# Patient Record
Sex: Male | Born: 1985 | Race: White | Hispanic: No | Marital: Single | Smoking: Current every day smoker
Health system: Southern US, Community
[De-identification: ages and names within clinical notes are randomized; demographics above are authoritative.]

---
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
## 2004-09-04 ENCOUNTER — Emergency Department (HOSPITAL_COMMUNITY): Admission: EM | Admit: 2004-09-04 | Discharge: 2004-09-04 | Payer: Self-pay | Admitting: Emergency Medicine

## 2005-03-06 ENCOUNTER — Emergency Department (HOSPITAL_COMMUNITY): Admission: EM | Admit: 2005-03-06 | Discharge: 2005-03-06 | Payer: Self-pay | Admitting: Emergency Medicine

## 2006-11-03 ENCOUNTER — Emergency Department (HOSPITAL_COMMUNITY): Admission: EM | Admit: 2006-11-03 | Discharge: 2006-11-03 | Payer: Self-pay | Admitting: Emergency Medicine

## 2009-03-21 ENCOUNTER — Emergency Department (HOSPITAL_COMMUNITY): Admission: EM | Admit: 2009-03-21 | Discharge: 2009-03-21 | Payer: Self-pay | Admitting: Emergency Medicine

## 2009-07-18 ENCOUNTER — Emergency Department (HOSPITAL_COMMUNITY): Admission: EM | Admit: 2009-07-18 | Discharge: 2009-07-18 | Payer: Self-pay | Admitting: Emergency Medicine

## 2016-02-17 ENCOUNTER — Encounter (HOSPITAL_BASED_OUTPATIENT_CLINIC_OR_DEPARTMENT_OTHER): Payer: Self-pay | Admitting: Emergency Medicine

## 2016-02-17 ENCOUNTER — Emergency Department (HOSPITAL_BASED_OUTPATIENT_CLINIC_OR_DEPARTMENT_OTHER)
Admission: EM | Admit: 2016-02-17 | Discharge: 2016-02-17 | Disposition: A | Discharge status: Home / Self Care | Payer: Self-pay | Attending: Emergency Medicine | Admitting: Emergency Medicine

## 2016-02-17 ENCOUNTER — Emergency Department (HOSPITAL_BASED_OUTPATIENT_CLINIC_OR_DEPARTMENT_OTHER): Payer: Self-pay

## 2016-02-17 DIAGNOSIS — Y939 Activity, unspecified: Secondary | ICD-10-CM | POA: Insufficient documentation

## 2016-02-17 DIAGNOSIS — S80811A Abrasion, right lower leg, initial encounter: Secondary | ICD-10-CM | POA: Insufficient documentation

## 2016-02-17 DIAGNOSIS — S80812A Abrasion, left lower leg, initial encounter: Secondary | ICD-10-CM | POA: Insufficient documentation

## 2016-02-17 DIAGNOSIS — W19XXXA Unspecified fall, initial encounter: Secondary | ICD-10-CM

## 2016-02-17 DIAGNOSIS — S99921A Unspecified injury of right foot, initial encounter: Secondary | ICD-10-CM

## 2016-02-17 DIAGNOSIS — W11XXXA Fall on and from ladder, initial encounter: Secondary | ICD-10-CM | POA: Insufficient documentation

## 2016-02-17 DIAGNOSIS — F17200 Nicotine dependence, unspecified, uncomplicated: Secondary | ICD-10-CM | POA: Insufficient documentation

## 2016-02-17 DIAGNOSIS — Y999 Unspecified external cause status: Secondary | ICD-10-CM | POA: Insufficient documentation

## 2016-02-17 DIAGNOSIS — Y92019 Unspecified place in single-family (private) house as the place of occurrence of the external cause: Secondary | ICD-10-CM | POA: Insufficient documentation

## 2016-02-17 MED ORDER — OXYCODONE-ACETAMINOPHEN 5-325 MG PO TABS
1.0000 | ORAL_TABLET | Freq: Once | ORAL | Status: AC
  Administered 2016-02-17: 1 via ORAL
  Filled 2016-02-17: qty 1

## 2016-02-17 MED ORDER — NAPROXEN 250 MG PO TABS
500.0000 mg | ORAL_TABLET | Freq: Once | ORAL | Status: AC
Start: 2016-02-17 — End: 2016-02-17
  Administered 2016-02-17: 500 mg via ORAL
  Filled 2016-02-17: qty 2

## 2016-02-17 MED ORDER — NAPROXEN 500 MG PO TABS: 500.0000 mg | ORAL_TABLET | Freq: Two times a day (BID) | ORAL | 0 refills | Status: AC

## 2016-02-17 NOTE — ED Notes (Signed)
Pt given d/c instructions as per chart. Rx x 1. Verbalizes understanding. No questions. 

## 2016-02-17 NOTE — Discharge Instructions (Signed)
Expect your soreness to increase over the next 2-3 days. Take it easy, but do not lay around too much as this may make the stiffness worse. Take 500 mg of naproxen every 12 hours or 800 mg of ibuprofen every 8 hours for the next 3 days. Take these medications with food to avoid upset stomach.  Follow-up with orthopedics should symptoms fail to resolve. Weightbearing as tolerated. Elevate the extremity whenever possible. Apply ice to reduce inflammation. Naproxen or ibuprofen as needed to reduce pain and inflammation.

## 2016-02-17 NOTE — ED Triage Notes (Signed)
Pt in c/o R ankle and foot pain following fall from ladder today. Pt alert, interactive, in NAD. To triage in wheelchair.

## 2016-02-17 NOTE — ED Notes (Signed)
Fell from ladder about 1530 this afternoon. Rode ladder down and landed on concrete. Abrasions to bilat LE. C/O right foot and ankle pain. No LOC.

## 2016-02-17 NOTE — ED Provider Notes (Signed)
[facility]-EMERGENCY DEPT Provider Note   CSN: [id number] Arrival date & time: 02/17/16  [id number]  By signing my name below, I, Alex Freeman, attest that this documentation has been prepared under the direction and in the presence of Alex Mandell, PA-C Electronically Signed: Soijett Freeman, ED Scribe. 02/17/16. 8:18 PM.    History   Chief Complaint Chief Complaint  Patient presents with  . Fall  . Foot Pain    HPI Alex Freeman is a 30 y.o. male who presents to the Emergency Department complaining of fall onset PTA. He notes that he was installing a window onto his house while on a 12 foot extension ladder when he fell due to the ladder sliding out from underneath him. Patient states that he was about halfway up the ladder and "rode the ladder to the ground." Pt is having associated symptoms of abrasions to bilateral shins, right ankle pain, and right foot pain. He notes that he has tried ice without medications for the relief of his symptoms. He denies hitting his head, LOC, neuro deficits, neck/back pain, or any other symptoms or complaints.     The history is provided by the patient. No language interpreter was used.    History reviewed. No pertinent past medical history.  There are no active problems to display for this patient.   History reviewed. No pertinent surgical history.     Home Medications    Prior to Admission medications   Medication Sig Start Date End Date Taking? Authorizing Provider  naproxen (NAPROSYN) 500 MG tablet Take 1 tablet (500 mg total) by mouth 2 (two) times daily. 02/17/16   Anselm PancoastShawn C Gloristine Turrubiates, PA-C    Family History History reviewed. No pertinent family history.  Social History Social History  Substance Use Topics  . Smoking status: Current Every Day Smoker  . Smokeless tobacco: Not on file  . Alcohol use Yes     Allergies   Review of patient's allergies indicates no known allergies.   Review of Systems Review of Systems  Respiratory: Negative for  shortness of breath.   Cardiovascular: Negative for chest pain.  Gastrointestinal: Negative for abdominal pain, nausea and vomiting.  Musculoskeletal: Positive for arthralgias (right ankle and right foot). Negative for back pain, joint swelling, neck pain and neck stiffness.  Skin: Positive for wound (multiple abrasions to BLE). Negative for color change.  Neurological: Negative for dizziness, syncope, weakness, light-headedness, numbness and headaches.  All other systems reviewed and are negative.    Physical Exam Updated Vital Signs BP 145/90   Pulse 78   Temp 98.1 F (36.7 C)   Resp 18   Ht 5\' 10"  (1.778 m)   Wt 205 lb (93 kg)   SpO2 95%   BMI 29.41 kg/m   Physical Exam  Constitutional: He is oriented to person, place, and time. He appears well-developed and well-nourished. No distress.  HENT:  Head: Normocephalic and atraumatic.  Eyes: Conjunctivae and EOM are normal. Pupils are equal, round, and reactive to light.  Neck: Neck supple.  Cardiovascular: Normal rate, regular rhythm, normal heart sounds and intact distal pulses.   Pulmonary/Chest: Effort normal and breath sounds normal. No respiratory distress.  Abdominal: Soft. He exhibits no distension. There is no tenderness. There is no guarding.  Musculoskeletal: Normal range of motion. He exhibits no edema.       Right ankle: He exhibits swelling. He exhibits normal range of motion. Tenderness. Lateral malleolus and medial malleolus tenderness found. Achilles tendon normal.  Tenderness and  mild swelling over right lateral malleolus and to the area inferior to the right medial malleolus. Right achilles tendon intact. Circulation, motor, and sensory intact. Multiple abrasions across both anterior lower legs. Full ROM in all extremities and spine. No midline spinal tenderness.   Lymphadenopathy:    He has no cervical adenopathy.  Neurological: He is alert and oriented to person, place, and time. He has normal strength. No  sensory deficit. Coordination normal.  No sensory deficits. Strength 5/5 in all extremities. No gait disturbance. Coordination intact. Cranial nerves III-XII grossly intact.   Skin: Skin is warm and dry. He is not diaphoretic.  Psychiatric: He has a normal mood and affect. His behavior is normal.  Nursing note and vitals reviewed.    ED Treatments / Results  DIAGNOSTIC STUDIES: Oxygen Saturation is 95% on RA, adequate by my interpretation.    COORDINATION OF CARE: 8:13 PM Discussed treatment plan with pt at bedside which includes right ankle xray, right foot xray, naprosyn Rx, ice, ace wrap, and pt agreed to plan.   Radiology Dg Ankle Complete Right  Result Date: 02/17/2016 CLINICAL DATA:  Fall.  Right ankle pain. EXAM: RIGHT ANKLE - COMPLETE 3+ VIEW COMPARISON:  None. FINDINGS: There is no evidence of fracture, dislocation, or joint effusion. There is no evidence of arthropathy or other focal bone abnormality. Soft tissues are unremarkable. IMPRESSION: Negative. Electronically Signed   By: Delbert Phenix M.D.   On: 02/17/2016 19:37   Dg Foot Complete Right  Result Date: 02/17/2016 CLINICAL DATA:  Fall.  Right foot pain. EXAM: RIGHT FOOT COMPLETE - 3+ VIEW COMPARISON:  None. FINDINGS: There is no evidence of fracture or dislocation. There is no evidence of arthropathy or other focal bone abnormality. Soft tissues are unremarkable. IMPRESSION: Negative. Electronically Signed   By: Delbert Phenix M.D.   On: 02/17/2016 19:37    Procedures Procedures (including critical care time)  Medications Ordered in ED Medications  naproxen (NAPROSYN) tablet 500 mg (500 mg Oral Given 02/17/16 2044)  oxyCODONE-acetaminophen (PERCOCET/ROXICET) 5-325 MG per tablet 1 tablet (1 tablet Oral Given 02/17/16 2044)     Initial Impression / Assessment and Plan / ED Course  I have reviewed the triage vital signs and the nursing notes.  Pertinent imaging results that were available during my care of the patient  were reviewed by me and considered in my medical decision making (see chart for details).  Clinical Course    Patient X-Ray negative for obvious fracture or dislocation.  Pt advised to follow up with orthopedics. Patient given ace wrap while in ED. Will be discharged home with naprosyn Rx. Symptomatic therapy recommended and discussed. Patient will be discharged home & is agreeable with above plan. Return precautions discussed.   Findings and plan of care discussed with Drema Pry, MD.   Vitals:   02/17/16 1906 02/17/16 1907  BP: 145/90   Pulse: 78   Resp: 18   Temp: 98.1 F (36.7 C)   SpO2: 95%   Weight:  93 kg  Height:  5\' 10"  (1.778 m)     Final Clinical Impressions(s) / ED Diagnoses   Final diagnoses:  Fall, initial encounter  Foot injury, right, initial encounter    New Prescriptions Discharge Medication List as of 02/17/2016  8:18 PM    START taking these medications   Details  naproxen (NAPROSYN) 500 MG tablet Take 1 tablet (500 mg total) by mouth 2 (two) times daily., Starting Sat 02/17/2016, Print  I personally performed the services described in this documentation, which was scribed in my presence. The recorded information has been reviewed and is accurate.     Anselm Pancoast, PA-C 02/19/16 1749    Nira Conn, MD 02/21/16 [phone]

## 2016-09-05 ENCOUNTER — Emergency Department (HOSPITAL_COMMUNITY)
Admission: EM | Admit: 2016-09-05 | Discharge: 2016-09-05 | Disposition: A | Discharge status: Home / Self Care | Payer: Self-pay | Attending: Emergency Medicine | Admitting: Emergency Medicine

## 2016-09-05 ENCOUNTER — Encounter (HOSPITAL_COMMUNITY): Payer: Self-pay | Admitting: Emergency Medicine

## 2016-09-05 ENCOUNTER — Emergency Department (HOSPITAL_COMMUNITY): Payer: Self-pay

## 2016-09-05 DIAGNOSIS — S80812A Abrasion, left lower leg, initial encounter: Secondary | ICD-10-CM | POA: Insufficient documentation

## 2016-09-05 DIAGNOSIS — R51 Headache: Secondary | ICD-10-CM | POA: Insufficient documentation

## 2016-09-05 DIAGNOSIS — F17200 Nicotine dependence, unspecified, uncomplicated: Secondary | ICD-10-CM | POA: Insufficient documentation

## 2016-09-05 DIAGNOSIS — Y939 Activity, unspecified: Secondary | ICD-10-CM | POA: Insufficient documentation

## 2016-09-05 DIAGNOSIS — R109 Unspecified abdominal pain: Secondary | ICD-10-CM | POA: Insufficient documentation

## 2016-09-05 DIAGNOSIS — S39012A Strain of muscle, fascia and tendon of lower back, initial encounter: Secondary | ICD-10-CM | POA: Insufficient documentation

## 2016-09-05 DIAGNOSIS — Y999 Unspecified external cause status: Secondary | ICD-10-CM | POA: Insufficient documentation

## 2016-09-05 DIAGNOSIS — R918 Other nonspecific abnormal finding of lung field: Secondary | ICD-10-CM | POA: Insufficient documentation

## 2016-09-05 DIAGNOSIS — Y92410 Unspecified street and highway as the place of occurrence of the external cause: Secondary | ICD-10-CM | POA: Insufficient documentation

## 2016-09-05 DIAGNOSIS — S80811A Abrasion, right lower leg, initial encounter: Secondary | ICD-10-CM | POA: Insufficient documentation

## 2016-09-05 LAB — I-STAT CHEM 8, ED
BUN: 8 mg/dL (ref 6–20)
CHLORIDE: 106 mmol/L (ref 101–111)
Calcium, Ion: 1.16 mmol/L (ref 1.15–1.40)
Creatinine, Ser: 1 mg/dL (ref 0.61–1.24)
Glucose, Bld: 93 mg/dL (ref 65–99)
HEMATOCRIT: 41 % (ref 39.0–52.0)
Hemoglobin: 13.9 g/dL (ref 13.0–17.0)
POTASSIUM: 3.7 mmol/L (ref 3.5–5.1)
SODIUM: 139 mmol/L (ref 135–145)
TCO2: 24 mmol/L (ref 0–100)

## 2016-09-05 LAB — URINALYSIS, ROUTINE W REFLEX MICROSCOPIC
Bilirubin Urine: NEGATIVE
Glucose, UA: NEGATIVE mg/dL
Hgb urine dipstick: NEGATIVE
Ketones, ur: NEGATIVE mg/dL
LEUKOCYTES UA: NEGATIVE
Nitrite: NEGATIVE
Protein, ur: NEGATIVE mg/dL
SPECIFIC GRAVITY, URINE: 1.002 — AB (ref 1.005–1.030)
pH: 6 (ref 5.0–8.0)

## 2016-09-05 MED ORDER — FENTANYL CITRATE (PF) 100 MCG/2ML IJ SOLN
50.0000 ug | Freq: Once | INTRAMUSCULAR | Status: AC
  Administered 2016-09-05: 50 ug via INTRAVENOUS
  Filled 2016-09-05: qty 2

## 2016-09-05 MED ORDER — IOPAMIDOL (ISOVUE-300) INJECTION 61%
100.0000 mL | Freq: Once | INTRAVENOUS | Status: AC | PRN
  Administered 2016-09-05: 100 mL via INTRAVENOUS

## 2016-09-05 MED ORDER — HYDROCODONE-ACETAMINOPHEN 5-325 MG PO TABS: 1.0000 | ORAL_TABLET | Freq: Four times a day (QID) | ORAL | 0 refills | Status: AC | PRN

## 2016-09-05 MED ORDER — SODIUM CHLORIDE 0.9 % IV SOLN
INTRAVENOUS | Status: DC
  Administered 2016-09-05: 20:00:00 via INTRAVENOUS

## 2016-09-05 MED ORDER — SODIUM CHLORIDE 0.9 % IV SOLN: INTRAVENOUS | Status: DC

## 2016-09-05 MED ORDER — ONDANSETRON HCL 4 MG/2ML IJ SOLN
4.0000 mg | Freq: Once | INTRAMUSCULAR | Status: AC
  Administered 2016-09-05: 4 mg via INTRAVENOUS
  Filled 2016-09-05: qty 2

## 2016-09-05 MED ORDER — NAPROXEN 500 MG PO TABS: 500.0000 mg | ORAL_TABLET | Freq: Two times a day (BID) | ORAL | 0 refills | Status: AC
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
## 2016-09-05 NOTE — ED Notes (Signed)
Patient was given 2 mg of morphine IV by [facility].
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
## 2016-09-05 NOTE — ED Notes (Signed)
Pt alert & oriented x4, stable gait. Patient given discharge instructions, paperwork & prescription(s). Patient informed not to drive, operate any equipment & handel any important documents 4 hours after taking pain medication. Patient  instructed to stop at the registration desk to finish any additional paperwork. Patient  verbalized understanding. Pt left department w/ no further questions. 

## 2017-02-02 IMAGING — CR DG ANKLE COMPLETE 3+V*R*
3 series · 3 of 3 positions shown · non-contrast
Comparison: None.

CLINICAL DATA: Fall.  Right ankle pain.

EXAM:
RIGHT ANKLE - COMPLETE 3+ VIEW

[t ankle joint ap right]
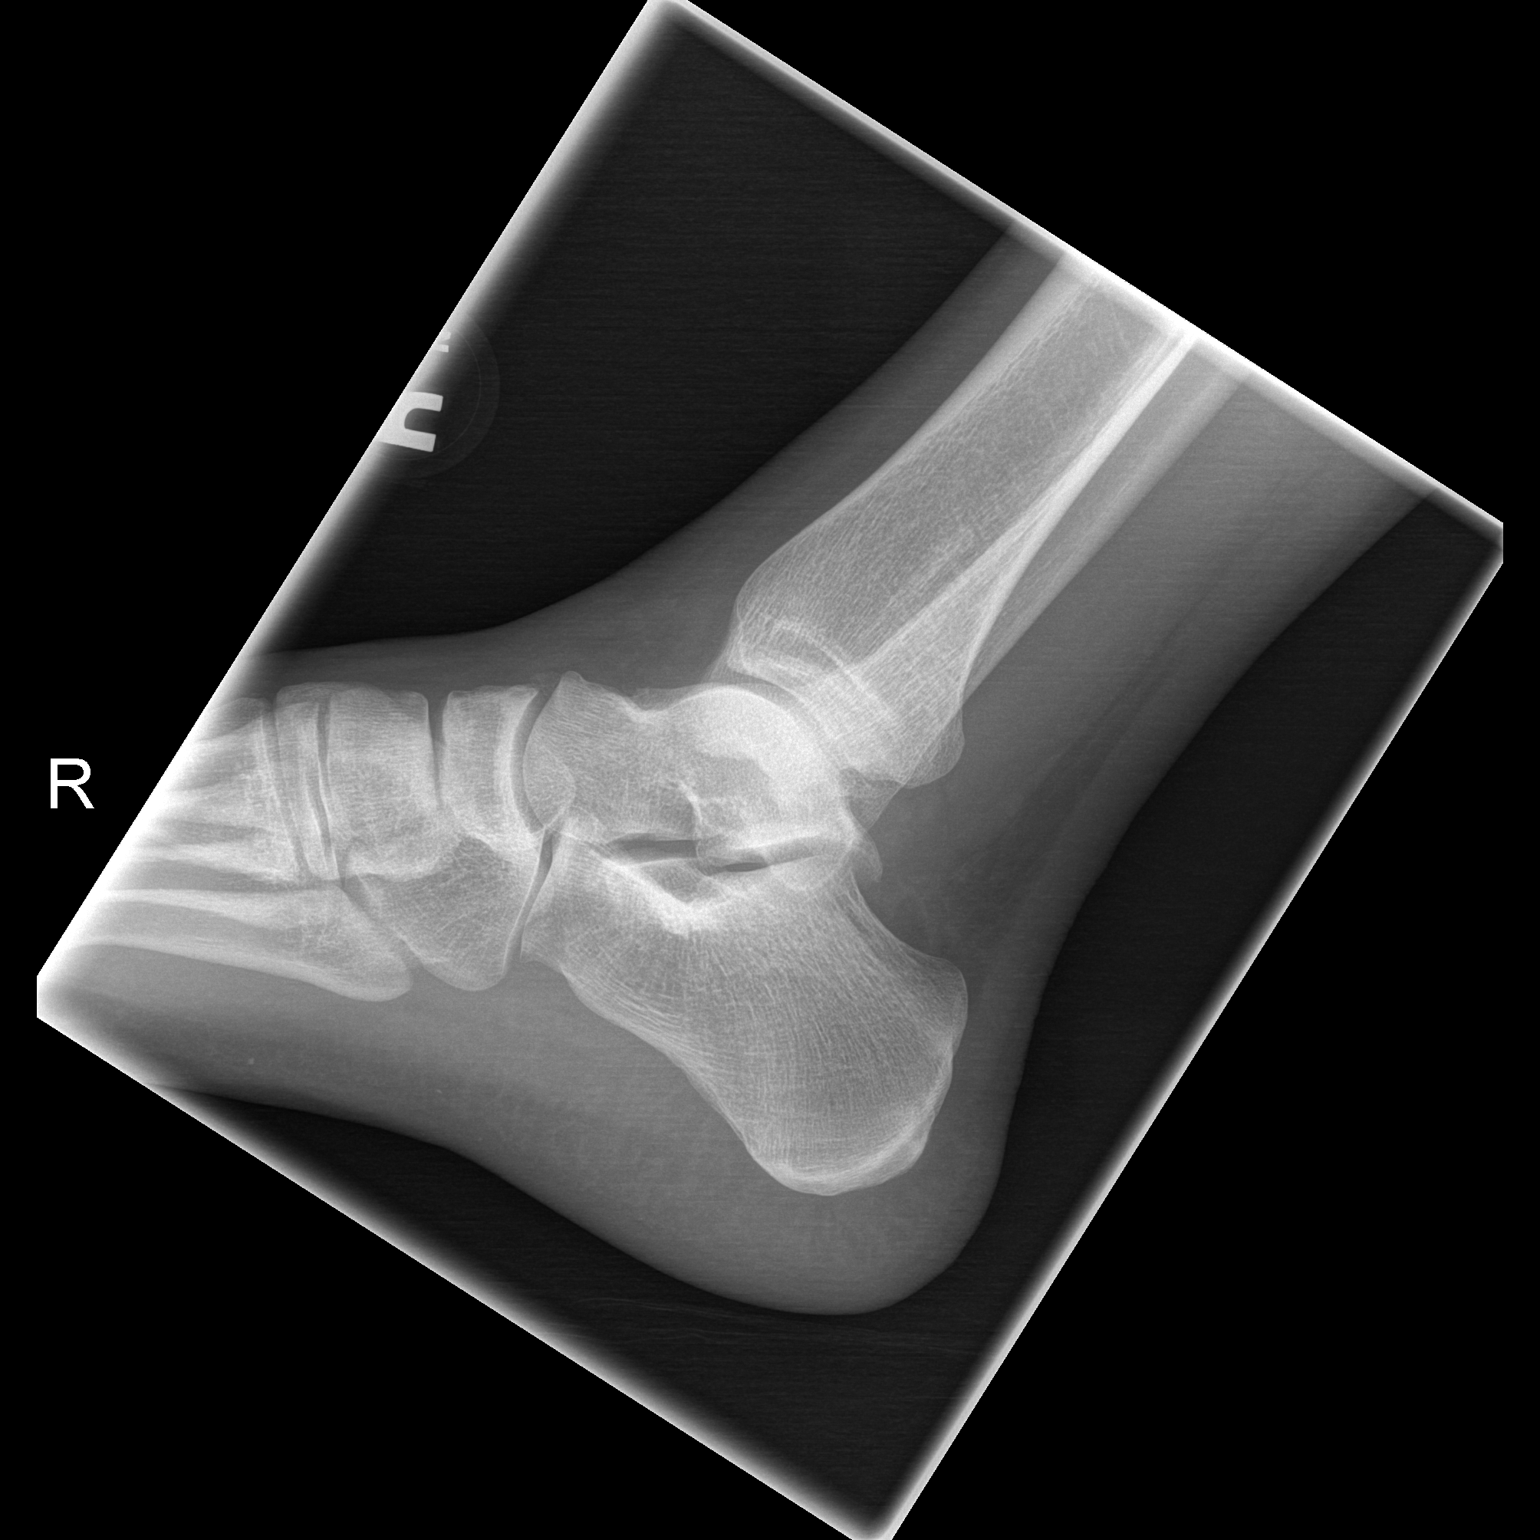

[t ankle joint oblique right]
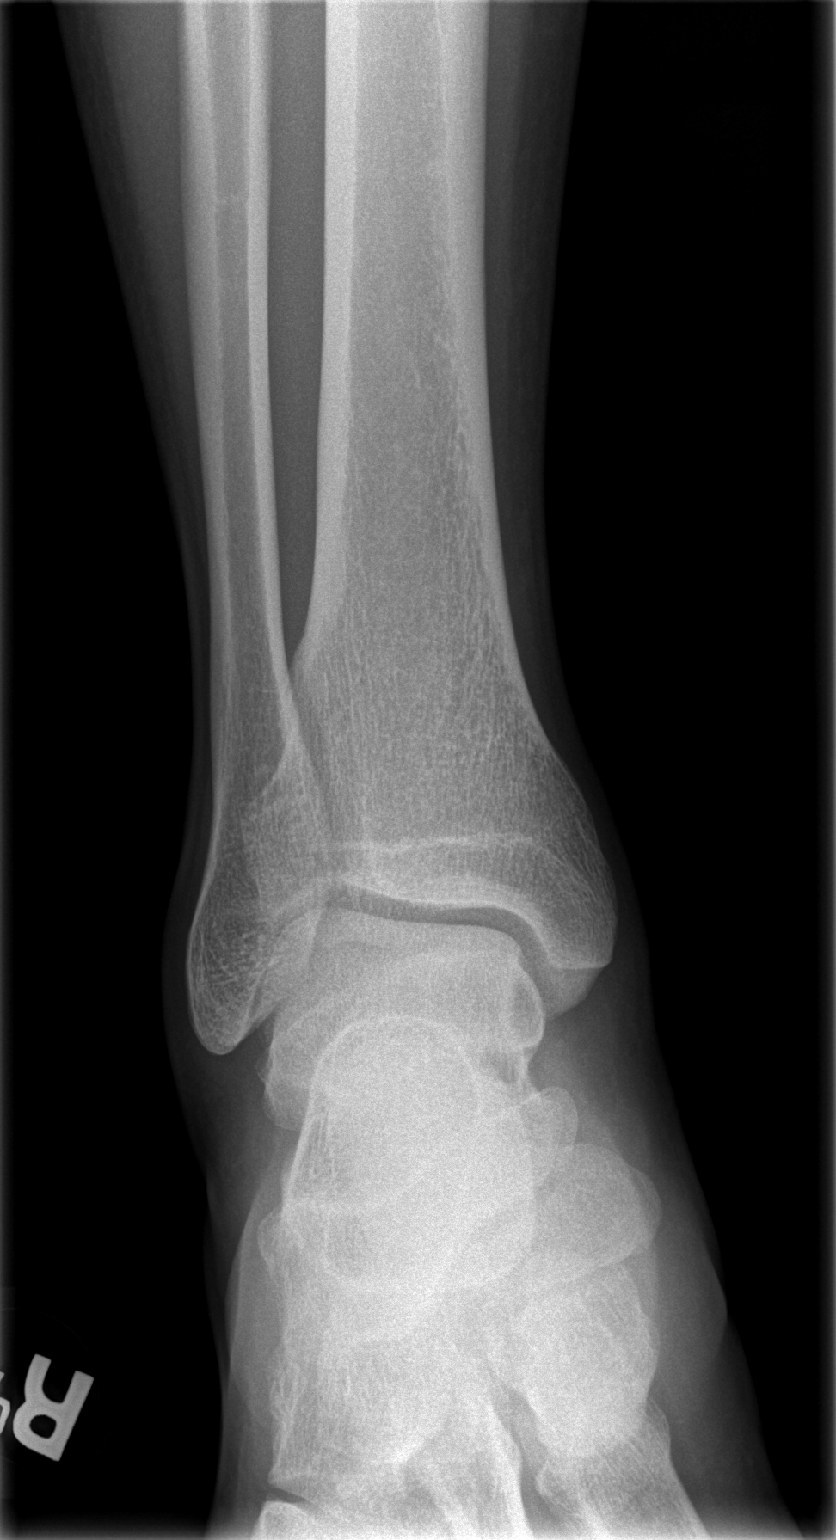

[t ankle joint lat right]
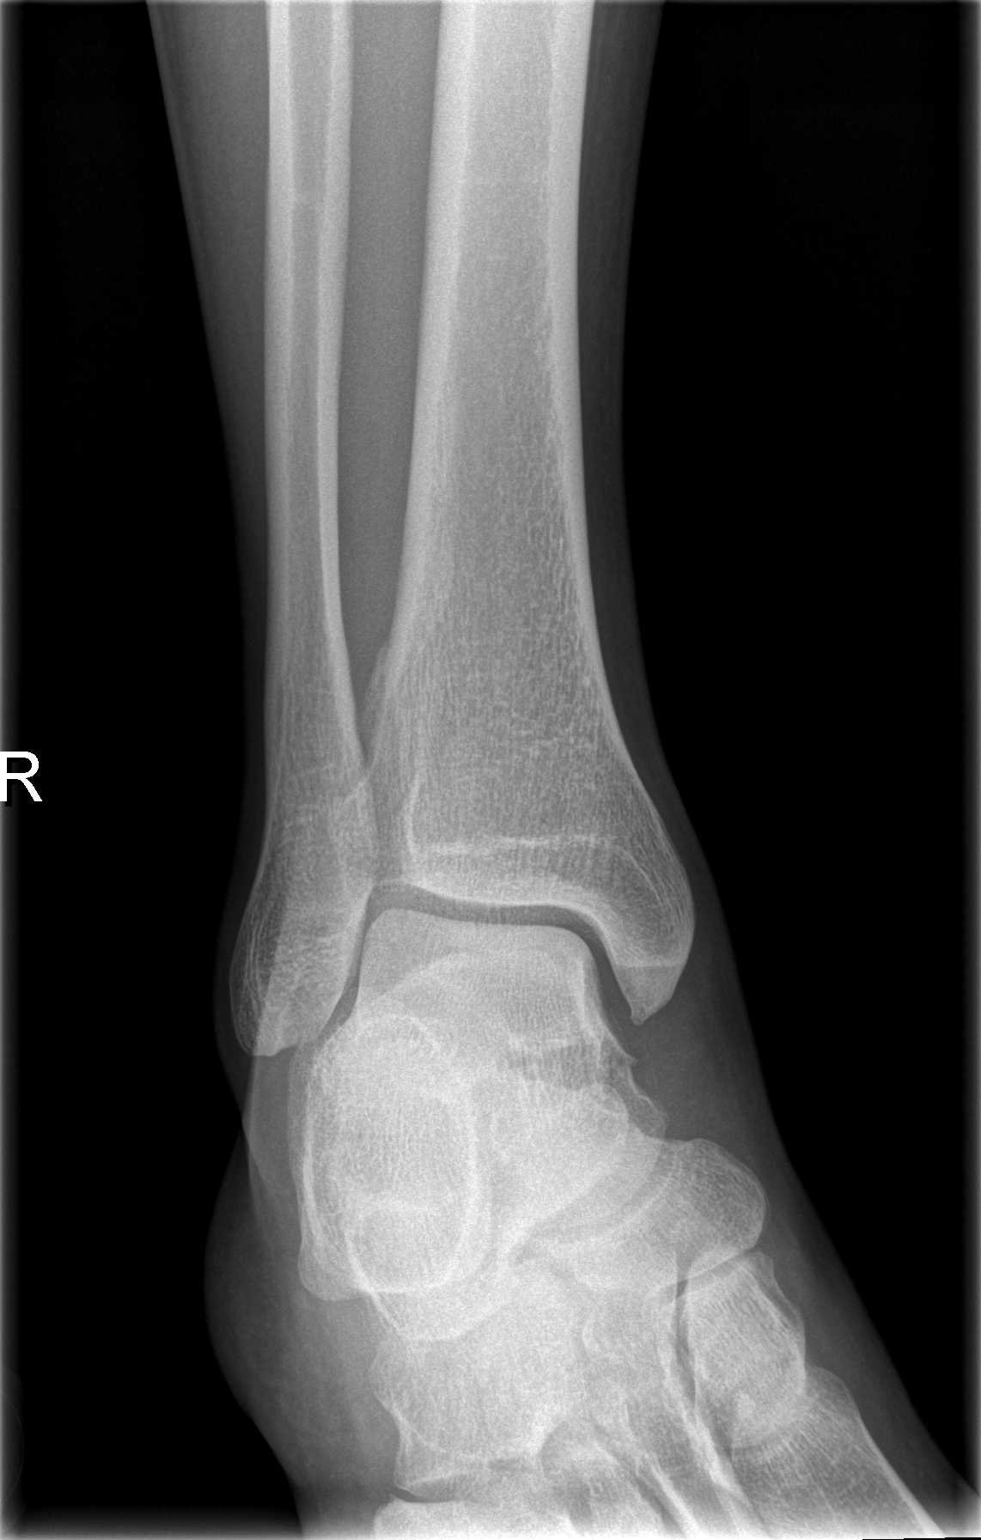

[3 of 3 positions shown; findings below may reference images not displayed]

FINDINGS: There is no evidence of fracture, dislocation, or joint effusion.
There is no evidence of arthropathy or other focal bone abnormality.
Soft tissues are unremarkable.
IMPRESSION: Negative.
